# Patient Record
Sex: Male | Born: 1990 | Race: White | Hispanic: No | Smoking: Current every day smoker
Health system: Southern US, Community
[De-identification: ages and names within clinical notes are randomized; demographics above are authoritative.]

---
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
## 2010-01-30 ENCOUNTER — Emergency Department (HOSPITAL_COMMUNITY): Admission: EM | Admit: 2010-01-30 | Discharge: 2010-01-30 | Payer: Self-pay | Admitting: Emergency Medicine

## 2010-07-09 ENCOUNTER — Emergency Department (HOSPITAL_COMMUNITY): Admission: EM | Admit: 2010-07-09 | Discharge: 2010-07-10 | Payer: Self-pay | Admitting: Emergency Medicine

## 2010-07-12 ENCOUNTER — Emergency Department (HOSPITAL_COMMUNITY): Admission: EM | Admit: 2010-07-12 | Discharge: 2010-07-12 | Payer: Self-pay | Admitting: Emergency Medicine

## 2010-09-15 ENCOUNTER — Emergency Department (HOSPITAL_COMMUNITY)
Admission: EM | Admit: 2010-09-15 | Discharge: 2010-09-15 | Payer: Self-pay | Source: Home / Self Care | Admitting: Emergency Medicine

## 2010-10-15 ENCOUNTER — Emergency Department (HOSPITAL_COMMUNITY)
Admission: EM | Admit: 2010-10-15 | Discharge: 2010-10-15 | Payer: Self-pay | Source: Home / Self Care | Admitting: Emergency Medicine

## 2010-10-15 LAB — COMPREHENSIVE METABOLIC PANEL
Albumin: 4.3 g/dL (ref 3.5–5.2)
BUN: 9 mg/dL (ref 6–23)
Calcium: 9.5 mg/dL (ref 8.4–10.5)
Creatinine, Ser: 0.84 mg/dL (ref 0.4–1.5)
Total Protein: 6.9 g/dL (ref 6.0–8.3)

## 2010-10-15 LAB — DIFFERENTIAL
Eosinophils Absolute: 0.1 10*3/uL (ref 0.0–0.7)
Eosinophils Relative: 3 % (ref 0–5)
Lymphs Abs: 1.3 10*3/uL (ref 0.7–4.0)
Monocytes Absolute: 0.3 10*3/uL (ref 0.1–1.0)
Monocytes Relative: 8 % (ref 3–12)

## 2010-10-15 LAB — CBC
MCH: 31.4 pg (ref 26.0–34.0)
MCHC: 35.8 g/dL (ref 30.0–36.0)
MCV: 87.7 fL (ref 78.0–100.0)
Platelets: 236 10*3/uL (ref 150–400)
RDW: 13 % (ref 11.5–15.5)
WBC: 4 10*3/uL (ref 4.0–10.5)

## 2010-10-28 ENCOUNTER — Emergency Department (HOSPITAL_COMMUNITY)
Admission: EM | Admit: 2010-10-28 | Discharge: 2010-10-28 | Disposition: A | Discharge status: Home / Self Care | Payer: Self-pay | Attending: Emergency Medicine | Admitting: Emergency Medicine

## 2010-10-28 DIAGNOSIS — T23179A Burn of first degree of unspecified wrist, initial encounter: Secondary | ICD-10-CM | POA: Insufficient documentation

## 2010-10-28 DIAGNOSIS — Y93G3 Activity, cooking and baking: Secondary | ICD-10-CM | POA: Insufficient documentation

## 2010-10-28 DIAGNOSIS — T23169A Burn of first degree of back of unspecified hand, initial encounter: Secondary | ICD-10-CM | POA: Insufficient documentation

## 2010-10-28 DIAGNOSIS — X12XXXA Contact with other hot fluids, initial encounter: Secondary | ICD-10-CM | POA: Insufficient documentation

## 2010-10-28 DIAGNOSIS — X131XXA Other contact with steam and other hot vapors, initial encounter: Secondary | ICD-10-CM | POA: Insufficient documentation

## 2010-11-27 LAB — URINALYSIS, ROUTINE W REFLEX MICROSCOPIC
Glucose, UA: NEGATIVE mg/dL
Hgb urine dipstick: NEGATIVE
Specific Gravity, Urine: 1.025 (ref 1.005–1.030)

## 2011-03-21 ENCOUNTER — Emergency Department (HOSPITAL_COMMUNITY)
Admission: EM | Admit: 2011-03-21 | Discharge: 2011-03-21 | Disposition: A | Discharge status: Home / Self Care | Payer: Self-pay | Attending: Emergency Medicine | Admitting: Emergency Medicine

## 2011-03-21 ENCOUNTER — Emergency Department (HOSPITAL_COMMUNITY): Payer: Self-pay

## 2011-03-21 DIAGNOSIS — R296 Repeated falls: Secondary | ICD-10-CM | POA: Insufficient documentation

## 2011-03-21 DIAGNOSIS — M542 Cervicalgia: Secondary | ICD-10-CM | POA: Insufficient documentation

## 2011-03-21 DIAGNOSIS — IMO0002 Reserved for concepts with insufficient information to code with codable children: Secondary | ICD-10-CM | POA: Insufficient documentation

## 2011-03-21 DIAGNOSIS — Y9316 Activity, rowing, canoeing, kayaking, rafting and tubing: Secondary | ICD-10-CM | POA: Insufficient documentation

## 2011-05-11 ENCOUNTER — Emergency Department (HOSPITAL_COMMUNITY)
Admission: EM | Admit: 2011-05-11 | Discharge: 2011-05-11 | Disposition: A | Discharge status: Home / Self Care | Payer: Self-pay | Attending: Emergency Medicine | Admitting: Emergency Medicine

## 2011-05-11 ENCOUNTER — Emergency Department (HOSPITAL_COMMUNITY): Payer: Self-pay

## 2011-05-11 DIAGNOSIS — S43402A Unspecified sprain of left shoulder joint, initial encounter: Secondary | ICD-10-CM

## 2011-05-11 DIAGNOSIS — IMO0002 Reserved for concepts with insufficient information to code with codable children: Secondary | ICD-10-CM | POA: Insufficient documentation

## 2011-05-11 DIAGNOSIS — F17200 Nicotine dependence, unspecified, uncomplicated: Secondary | ICD-10-CM | POA: Insufficient documentation

## 2011-05-11 DIAGNOSIS — Y92009 Unspecified place in unspecified non-institutional (private) residence as the place of occurrence of the external cause: Secondary | ICD-10-CM | POA: Insufficient documentation

## 2011-05-11 DIAGNOSIS — W1789XA Other fall from one level to another, initial encounter: Secondary | ICD-10-CM | POA: Insufficient documentation

## 2011-05-11 MED ORDER — OXYCODONE-ACETAMINOPHEN 5-325 MG PO TABS
1.0000 | ORAL_TABLET | Freq: Once | ORAL | Status: AC
  Administered 2011-05-11: 1 via ORAL
  Filled 2011-05-11: qty 1

## 2011-05-11 MED ORDER — NAPROXEN 500 MG PO TABS: 500.0000 mg | ORAL_TABLET | Freq: Two times a day (BID) | ORAL | Status: AC
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
## 2011-06-11 LAB — CBC
HCT: 40.2
Hemoglobin: 14.3
MCHC: 35.6
MCV: 90.4
Platelets: 221
RBC: 4.45
RDW: 13.3
WBC: 10.7

## 2011-06-11 LAB — BASIC METABOLIC PANEL
CO2: 29
Calcium: 9.8
Chloride: 104
Creatinine, Ser: 0.93
Glucose, Bld: 107 — ABNORMAL HIGH
Sodium: 142

## 2011-06-11 LAB — DIFFERENTIAL
Basophils Absolute: 0
Basophils Relative: 0
Eosinophils Absolute: 0.1
Eosinophils Relative: 1
Lymphocytes Relative: 11 — ABNORMAL LOW
Lymphs Abs: 1.1
Monocytes Absolute: 0.4
Monocytes Relative: 4
Neutro Abs: 9.1 — ABNORMAL HIGH
Neutrophils Relative %: 85 — ABNORMAL HIGH

## 2011-06-11 LAB — RAPID URINE DRUG SCREEN, HOSP PERFORMED
Amphetamines: NOT DETECTED
Barbiturates: NOT DETECTED
Benzodiazepines: NOT DETECTED
Cocaine: NOT DETECTED
Opiates: NOT DETECTED
Tetrahydrocannabinol: POSITIVE — AB

## 2011-06-11 LAB — ETHANOL: Alcohol, Ethyl (B): <5

## 2011-06-23 LAB — STREP A DNA PROBE

## 2011-12-06 ENCOUNTER — Emergency Department (HOSPITAL_COMMUNITY): Payer: BC Managed Care – PPO

## 2011-12-06 ENCOUNTER — Encounter (HOSPITAL_COMMUNITY): Payer: Self-pay | Admitting: Emergency Medicine

## 2011-12-06 ENCOUNTER — Emergency Department (HOSPITAL_COMMUNITY)
Admission: EM | Admit: 2011-12-06 | Discharge: 2011-12-06 | Disposition: A | Discharge status: Home / Self Care | Payer: BC Managed Care – PPO | Attending: Emergency Medicine | Admitting: Emergency Medicine

## 2011-12-06 DIAGNOSIS — F17200 Nicotine dependence, unspecified, uncomplicated: Secondary | ICD-10-CM | POA: Insufficient documentation

## 2011-12-06 DIAGNOSIS — M79609 Pain in unspecified limb: Secondary | ICD-10-CM | POA: Insufficient documentation

## 2011-12-06 DIAGNOSIS — M795 Residual foreign body in soft tissue: Secondary | ICD-10-CM | POA: Insufficient documentation

## 2011-12-06 MED ORDER — HYDROCODONE-ACETAMINOPHEN 5-325 MG PO TABS: 1.0000 | ORAL_TABLET | ORAL | Status: AC | PRN

## 2011-12-06 MED ORDER — IBUPROFEN 600 MG PO TABS: 600.0000 mg | ORAL_TABLET | Freq: Four times a day (QID) | ORAL | Status: AC | PRN

## 2011-12-06 MED ORDER — IBUPROFEN 800 MG PO TABS
800.0000 mg | ORAL_TABLET | Freq: Once | ORAL | Status: AC
  Administered 2011-12-06: 800 mg via ORAL
  Filled 2011-12-06: qty 1

## 2011-12-06 NOTE — Discharge Instructions (Signed)
Foreign Body When the skin is cut or punctured and some object is left in the tissue under the skin, that object is called a "foreign body". A foreign body could be a wood splinter, a thorn, a sliver of metal, a shard of glass, a cactus needle or the tip of a pencil. In most instances, your caregiver will recommend that the foreign body be removed. If it is not removed, infection, abscess formation, an allergic reaction, chronic pain and disability can occur over time. Sometimes, foreign bodies (particularly very small ones) can be difficult to locate. Your caregiver may recommend x-rays or ultrasound imaging to help find them. If removal is not successful, there may be a need to see a surgeon who might suggest further exploration in the operating room. Occasionally, tiny bits of metallic foreign material (such as shrapnel) are not removed, if it is felt that there would be no harm in leaving them untouched. HOME CARE INSTRUCTIONS  Rest the injured area and keep it elevated until all the pain and swelling are gone.   You will need a tetanus vaccination if you have not had one in the last 5 years.   Return to this facility, see your caregiver or follow-up as instructed in 2 days.  SEEK IMMEDIATE MEDICAL CARE IF:   You develop increasing redness or swelling of the skin near the wound.   You develop drainage of pus from the wound.   You have persistent pain or loss of motion.   You have red streaks extending above or below the wound location.  MAKE SURE YOU:   Understand these instructions.   Will watch your condition.   Will get help right away if you are not doing well or get worse.  Document Released: 09/01/2005 Document Revised: 08/21/2011 Document Reviewed: 08/03/2009 [facility] Patient Information 2012 [place], [place].   You may take the hydrocodone prescribed for pain relief.  This will make you drowsy - do not drive within 4 hours of taking this medication.
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
## 2011-12-08 NOTE — ED Provider Notes (Signed)
Medical screening examination/treatment/procedure(s) were performed by non-physician practitioner and as supervising physician I was immediately available for consultation/collaboration.  Shelda Jakes, MD 12/08/11 [phone]

## 2012-11-20 ENCOUNTER — Encounter (HOSPITAL_COMMUNITY): Payer: Self-pay | Admitting: *Deleted

## 2012-11-20 ENCOUNTER — Emergency Department (HOSPITAL_COMMUNITY)
Admission: EM | Admit: 2012-11-20 | Discharge: 2012-11-20 | Disposition: A | Discharge status: Home / Self Care | Payer: Self-pay | Attending: Emergency Medicine | Admitting: Emergency Medicine

## 2012-11-20 DIAGNOSIS — R21 Rash and other nonspecific skin eruption: Secondary | ICD-10-CM | POA: Insufficient documentation

## 2012-11-20 DIAGNOSIS — F17200 Nicotine dependence, unspecified, uncomplicated: Secondary | ICD-10-CM | POA: Insufficient documentation

## 2012-11-20 DIAGNOSIS — IMO0002 Reserved for concepts with insufficient information to code with codable children: Secondary | ICD-10-CM | POA: Insufficient documentation

## 2012-11-20 DIAGNOSIS — M7989 Other specified soft tissue disorders: Secondary | ICD-10-CM | POA: Insufficient documentation

## 2012-11-20 DIAGNOSIS — R209 Unspecified disturbances of skin sensation: Secondary | ICD-10-CM | POA: Insufficient documentation

## 2012-11-20 MED ORDER — SULFAMETHOXAZOLE-TRIMETHOPRIM 800-160 MG PO TABS: 1.0000 | ORAL_TABLET | Freq: Two times a day (BID) | ORAL | Status: DC

## 2012-11-20 MED ORDER — SULFAMETHOXAZOLE-TMP DS 800-160 MG PO TABS
1.0000 | ORAL_TABLET | Freq: Once | ORAL | Status: AC
  Administered 2012-11-20: 1 via ORAL
  Filled 2012-11-20: qty 1

## 2012-11-20 MED ORDER — HYDROCODONE-ACETAMINOPHEN 5-325 MG PO TABS: ORAL_TABLET | ORAL | Status: DC

## 2012-11-20 NOTE — ED Notes (Signed)
PA-C at bedside 

## 2012-11-20 NOTE — ED Provider Notes (Signed)
History     CSN: [id number]  Arrival date & time 11/20/12  1442   First MD Initiated Contact with Patient 11/20/12 1541      Chief Complaint  Patient presents with  . Cellulitis    (Consider location/radiation/quality/duration/timing/severity/associated sxs/prior treatment) HPI Comments: Patient complains of pain to his left upper arm with redness and swelling after receiving a tattoo to the same area 3 days ago.  States that he noticed redness 2 days ago with significant drainage from the tattoo. He states that his arm is painful to touch and worse with movement. He denies fever, chills, numbness or weakness to the left upper extremity he also denies any neck pain or other skin lesions.  Patient is a 22 y.o. male presenting with arm injury. The history is provided by the patient.  Arm Injury Location:  Arm Time since incident:  3 days Injury: no   Arm location:  L upper arm Pain details:    Quality:  Aching, throbbing and tingling   Radiates to:  L arm   Severity:  Moderate   Onset quality:  Gradual   Timing:  Constant   Progression:  Worsening Chronicity:  New Handedness:  Right-handed Dislocation: no   Foreign body present:  No foreign bodies Prior injury to area:  No Relieved by:  Nothing Worsened by:  Movement Ineffective treatments:  None tried Associated symptoms: swelling and tingling   Associated symptoms: no decreased range of motion, no fever, no neck pain, no numbness and no stiffness   Risk factors: no recent illness     History reviewed. No pertinent past medical history.  History reviewed. No pertinent past surgical history.  No family history on file.  History  Substance Use Topics  . Smoking status: Current Every Day Smoker  . Smokeless tobacco: Not on file  . Alcohol Use: No      Review of Systems  Constitutional: Negative for fever, chills, activity change and appetite change.  HENT: Negative for sore throat, facial swelling, trouble  swallowing, neck pain and neck stiffness.   Respiratory: Negative for chest tightness, shortness of breath and wheezing.   Musculoskeletal: Negative for stiffness.  Skin: Positive for color change. Negative for rash and wound.       Tattoo to left upper arm  Neurological: Negative for dizziness, weakness, numbness and headaches.  All other systems reviewed and are negative.    Allergies  Review of patient's allergies indicates no known allergies.  Home Medications   Current Outpatient Rx  Name  Route  Sig  Dispense  Refill  . acetaminophen (TYLENOL) 500 MG tablet   Oral   Take 1,000 mg by mouth every 6 (six) hours as needed for pain.         Marland Kitchen ibuprofen (ADVIL,MOTRIN) 200 MG tablet   Oral   Take 600 mg by mouth every 6 (six) hours as needed for pain.           BP 127/65  Pulse 77  Temp(Src) 98.2 F (36.8 C) (Oral)  Resp 20  Ht 5\' 7"  (1.702 m)  Wt 150 lb (68.04 kg)  BMI 23.49 kg/m2  SpO2 100%  Physical Exam  Nursing note and vitals reviewed. Constitutional: He is oriented to person, place, and time. He appears well-developed and well-nourished. No distress.  HENT:  Head: Normocephalic and atraumatic.  Mouth/Throat: Oropharynx is clear and moist.  Neck: Normal range of motion. Neck supple.  Cardiovascular: Normal rate, regular rhythm, normal heart sounds and  intact distal pulses.   No murmur heard. Pulmonary/Chest: Effort normal and breath sounds normal. No respiratory distress. He exhibits no tenderness.  Musculoskeletal: Normal range of motion. He exhibits tenderness. He exhibits no edema.       Left upper arm: He exhibits tenderness and swelling. He exhibits no bony tenderness, no deformity and no laceration.       Arms: see skin exam  Lymphadenopathy:    He has no cervical adenopathy.  Neurological: He is alert and oriented to person, place, and time. No cranial nerve deficit or sensory deficit. He exhibits normal muscle tone. Coordination normal.  Skin:  Rash noted. There is erythema.  Recent tattoo to the medial aspect of the left upper arm with significant tenderness to palpation and surrounding erythema. There is a mild serous drainage and induration surrounding the tattoo. Distal sensation is intact, grip strength is equal bilaterally. Radial pulses are brisk and symmetrical. Patient has full range of motion of the upper extremity.    ED Course  Procedures (including critical care time)  Labs Reviewed - No data to display No results found.      MDM    Patient has a 3-4 day old tattoo of th left upper arm with serous drainage an surrounding erythema.  Likely cellulitis.  Will treat with antibiotic and pain medications.  Patient agrees to elevate, warm compresses, and return here in 2 days for recehck.  Tattoo was cleaned and bandaged by the nursing staff. Initial dose of antibiotic given  Patient appears stable for discharge. He agrees to care plan and verbalized understanding.  Prescribed Bactrim DS Norco #20   Tammy L. Trisha Mangle, PA-C 11/21/12 1805

## 2012-11-20 NOTE — ED Notes (Signed)
Pt c/o infection to tattoo. States that the area is red, raised, drainage noted. Received tattoo on Wednesday.

## 2012-11-22 ENCOUNTER — Encounter (HOSPITAL_COMMUNITY): Payer: Self-pay | Admitting: *Deleted

## 2012-11-22 ENCOUNTER — Emergency Department (HOSPITAL_COMMUNITY)
Admission: EM | Admit: 2012-11-22 | Discharge: 2012-11-22 | Disposition: A | Discharge status: Home / Self Care | Payer: Self-pay | Attending: Emergency Medicine | Admitting: Emergency Medicine

## 2012-11-22 DIAGNOSIS — IMO0002 Reserved for concepts with insufficient information to code with codable children: Secondary | ICD-10-CM | POA: Insufficient documentation

## 2012-11-22 DIAGNOSIS — F17200 Nicotine dependence, unspecified, uncomplicated: Secondary | ICD-10-CM | POA: Insufficient documentation

## 2012-11-22 MED ORDER — NAPROXEN 500 MG PO TABS: 500.0000 mg | ORAL_TABLET | Freq: Two times a day (BID) | ORAL | Status: DC

## 2012-11-22 NOTE — ED Notes (Addendum)
Infected tattoo to lt upper.arm  Seen here for same

## 2012-11-23 NOTE — ED Provider Notes (Signed)
Medical screening examination/treatment/procedure(s) were performed by non-physician practitioner and as supervising physician I was immediately available for consultation/collaboration.   Shelda Jakes, MD 11/23/12 [phone]

## 2012-11-24 NOTE — ED Provider Notes (Signed)
History     CSN: [id number]  Arrival date & time 11/22/12  1350   First MD Initiated Contact with Patient 11/22/12 1441      Chief Complaint  Patient presents with  . Wound Check    (Consider location/radiation/quality/duration/timing/severity/associated sxs/prior treatment) HPI Comments: Patient returns to ED for re-evaluation of an infected tattoo.  Was seen here 2 days prior to this visit and also seen by me at that time.  States the pain has improved and the redness has also improved.  Currently taking bactrim DS and norco.  He denies any worsening symptoms  Patient is a 22 y.o. male presenting with wound check. The history is provided by the patient.  Wound Check This is a new problem. The current episode started in the past 7 days. The problem has been gradually improving. Pertinent negatives include no arthralgias, chills, fever, joint swelling, neck pain, numbness, sore throat, vomiting or weakness. The symptoms are aggravated by bending. He has tried oral narcotics (warm compresses and oral antibiotics) for the symptoms. The treatment provided significant relief.    History reviewed. No pertinent past medical history.  History reviewed. No pertinent past surgical history.  History reviewed. No pertinent family history.  History  Substance Use Topics  . Smoking status: Current Every Day Smoker  . Smokeless tobacco: Not on file  . Alcohol Use: No      Review of Systems  Constitutional: Negative for fever and chills.  HENT: Negative for sore throat, facial swelling, trouble swallowing, neck pain and neck stiffness.   Respiratory: Negative for chest tightness, shortness of breath and wheezing.   Gastrointestinal: Negative for vomiting.  Musculoskeletal: Negative for joint swelling and arthralgias.  Skin: Positive for color change.  Neurological: Negative for weakness and numbness.  All other systems reviewed and are negative.    Allergies  Review of patient's  allergies indicates no known allergies.  Home Medications   Current Outpatient Rx  Name  Route  Sig  Dispense  Refill  . acetaminophen (TYLENOL) 500 MG tablet   Oral   Take 1,000 mg by mouth every 6 (six) hours as needed for pain.         Marland Kitchen HYDROcodone-acetaminophen (NORCO/VICODIN) 5-325 MG per tablet   Oral   Take 1-2 tablets by mouth every 4 (four) hours as needed. Take one-two tabs po q 4-6 hrs prn pain         . ibuprofen (ADVIL,MOTRIN) 200 MG tablet   Oral   Take 600 mg by mouth every 6 (six) hours as needed for pain.         Marland Kitchen sulfamethoxazole-trimethoprim (SEPTRA DS) 800-160 MG per tablet   Oral   Take 1 tablet by mouth 2 (two) times daily.   28 tablet   0   . naproxen (NAPROSYN) 500 MG tablet   Oral   Take 1 tablet (500 mg total) by mouth 2 (two) times daily with a meal.   20 tablet   0     BP 125/63  Pulse 86  Temp(Src) 98.2 F (36.8 C) (Oral)  Resp 18  Ht 5\' 7"  (1.702 m)  Wt 150 lb (68.04 kg)  BMI 23.49 kg/m2  SpO2 96%  Physical Exam  Nursing note and vitals reviewed. Constitutional: He is oriented to person, place, and time. He appears well-developed and well-nourished. No distress.  HENT:  Head: Normocephalic and atraumatic.  Cardiovascular: Normal rate, regular rhythm and normal heart sounds.   No murmur heard. Pulmonary/Chest: Effort  normal and breath sounds normal.  Musculoskeletal: Normal range of motion. He exhibits tenderness. He exhibits no edema.  Tattoo to the medial aspect of the left upper arm with previous erythema and drainage.  Erythema has greatly diminished,  No induration or drainage present.  Distal sensation intact, radial pulse brisk, pt has FROM of the left UE  Neurological: He is alert and oriented to person, place, and time. He exhibits normal muscle tone. Coordination normal.  Skin: Skin is warm.  See MS exam    ED Course  Procedures (including critical care time)  Labs Reviewed - No data to display No results  found.   1. Cellulitis       MDM   Pt seen here today for recheck of cellulitis secondary to tattoo.  Seen by me on initial visit as well.  Previous sx's greatly improved.  Appears to be healing well.  No further drainage or increased erythema.  Pt agrees to cont warm compresses and antibiotics, return here if needed  The patient appears reasonably screened and/or stabilized for discharge and I doubt any other medical condition or other [facility] requiring further screening, evaluation, or treatment in the ED at this time prior to discharge.       Tammy L. Trisha Mangle, PA-C 11/24/12 1544

## 2012-11-25 NOTE — ED Provider Notes (Signed)
Medical screening examination/treatment/procedure(s) were performed by non-physician practitioner and as supervising physician I was immediately available for consultation/collaboration. Iva Knapp, MD, FACEP   Iva L Knapp, MD 11/25/12 0021 

## 2014-09-21 ENCOUNTER — Encounter (HOSPITAL_COMMUNITY): Payer: Self-pay | Admitting: *Deleted

## 2014-09-21 ENCOUNTER — Emergency Department (HOSPITAL_COMMUNITY): Payer: BLUE CROSS/BLUE SHIELD

## 2014-09-21 ENCOUNTER — Emergency Department (HOSPITAL_COMMUNITY)
Admission: EM | Admit: 2014-09-21 | Discharge: 2014-09-21 | Disposition: A | Discharge status: Home / Self Care | Payer: BLUE CROSS/BLUE SHIELD | Attending: Emergency Medicine | Admitting: Emergency Medicine

## 2014-09-21 DIAGNOSIS — Y288XXA Contact with other sharp object, undetermined intent, initial encounter: Secondary | ICD-10-CM | POA: Diagnosis not present

## 2014-09-21 DIAGNOSIS — Z720 Tobacco use: Secondary | ICD-10-CM | POA: Diagnosis not present

## 2014-09-21 DIAGNOSIS — T148XXA Other injury of unspecified body region, initial encounter: Secondary | ICD-10-CM

## 2014-09-21 DIAGNOSIS — Y929 Unspecified place or not applicable: Secondary | ICD-10-CM | POA: Diagnosis not present

## 2014-09-21 DIAGNOSIS — Y9389 Activity, other specified: Secondary | ICD-10-CM | POA: Insufficient documentation

## 2014-09-21 DIAGNOSIS — Z792 Long term (current) use of antibiotics: Secondary | ICD-10-CM | POA: Insufficient documentation

## 2014-09-21 DIAGNOSIS — S91342A Puncture wound with foreign body, left foot, initial encounter: Secondary | ICD-10-CM | POA: Insufficient documentation

## 2014-09-21 DIAGNOSIS — Z791 Long term (current) use of non-steroidal anti-inflammatories (NSAID): Secondary | ICD-10-CM | POA: Diagnosis not present

## 2014-09-21 DIAGNOSIS — Y998 Other external cause status: Secondary | ICD-10-CM | POA: Diagnosis not present

## 2014-09-21 DIAGNOSIS — S99922A Unspecified injury of left foot, initial encounter: Secondary | ICD-10-CM

## 2014-09-21 MED ORDER — CELECOXIB 100 MG PO CAPS: 100.0000 mg | ORAL_CAPSULE | Freq: Two times a day (BID) | ORAL | Status: DC

## 2014-09-21 MED ORDER — CIPROFLOXACIN HCL 250 MG PO TABS
500.0000 mg | ORAL_TABLET | Freq: Once | ORAL | Status: AC
  Administered 2014-09-21: 500 mg via ORAL
  Filled 2014-09-21: qty 2

## 2014-09-21 MED ORDER — KETOROLAC TROMETHAMINE 10 MG PO TABS
10.0000 mg | ORAL_TABLET | Freq: Once | ORAL | Status: AC
  Administered 2014-09-21: 10 mg via ORAL
  Filled 2014-09-21: qty 1

## 2014-09-21 MED ORDER — HYDROCODONE-ACETAMINOPHEN 5-325 MG PO TABS
1.0000 | ORAL_TABLET | Freq: Once | ORAL | Status: AC
  Administered 2014-09-21: 1 via ORAL
  Filled 2014-09-21: qty 1

## 2014-09-21 MED ORDER — HYDROCODONE-ACETAMINOPHEN 5-325 MG PO TABS: 1.0000 | ORAL_TABLET | ORAL | Status: DC | PRN

## 2014-09-21 MED ORDER — CIPROFLOXACIN HCL 500 MG PO TABS: 500.0000 mg | ORAL_TABLET | Freq: Two times a day (BID) | ORAL | Status: DC

## 2014-09-21 NOTE — ED Provider Notes (Signed)
CSN: [id number]     Arrival date & time 09/21/14  1453 History   First MD Initiated Contact with Patient 09/21/14 1551     Chief Complaint  Patient presents with  . Foot Injury     (Consider location/radiation/quality/duration/timing/severity/associated sxs/prior Treatment) Patient is a 24 y.o. male presenting with foot injury. The history is provided by the patient.  Foot Injury Location:  Foot Foot location:  L foot Pain details:    Quality:  Throbbing   Radiates to:  Does not radiate   Severity:  Moderate   Onset quality:  Gradual   Duration:  2 days   Timing:  Intermittent   Progression:  Worsening Chronicity:  New Dislocation: no   Prior injury to area:  No Relieved by:  Nothing Ineffective treatments: warm tub soak. Associated symptoms: swelling   Associated symptoms: no back pain, no fatigue, no fever, no neck pain and no numbness   Risk factors: no frequent fractures and no known bone disorder     History reviewed. No pertinent past medical history. History reviewed. No pertinent past surgical history. History reviewed. No pertinent family history. History  Substance Use Topics  . Smoking status: Current Every Day Smoker    Types: Cigarettes  . Smokeless tobacco: Not on file  . Alcohol Use: No    Review of Systems  Constitutional: Negative for fever, activity change and fatigue.       All ROS Neg except as noted in HPI  Eyes: Negative for photophobia and discharge.  Respiratory: Negative for cough, shortness of breath and wheezing.   Cardiovascular: Negative for chest pain and palpitations.  Gastrointestinal: Negative for abdominal pain and blood in stool.  Genitourinary: Negative for dysuria, frequency and hematuria.  Musculoskeletal: Negative for back pain, arthralgias and neck pain.  Skin: Negative.   Neurological: Negative for dizziness, seizures and speech difficulty.  Psychiatric/Behavioral: Negative for hallucinations and confusion.       Allergies  Review of patient's allergies indicates no known allergies.  Home Medications   Prior to Admission medications   Medication Sig Start Date End Date Taking? Authorizing Provider  acetaminophen (TYLENOL) 500 MG tablet Take 1,000 mg by mouth every 6 (six) hours as needed for pain.   Yes Historical Provider, MD  ibuprofen (ADVIL,MOTRIN) 200 MG tablet Take 600 mg by mouth every 6 (six) hours as needed for pain.   Yes Historical Provider, MD  naproxen (NAPROSYN) 500 MG tablet Take 1 tablet (500 mg total) by mouth 2 (two) times daily with a meal. Patient not taking: Reported on 09/21/2014 11/22/12   Tammy L. Triplett, PA-C  sulfamethoxazole-trimethoprim (SEPTRA DS) 800-160 MG per tablet Take 1 tablet by mouth 2 (two) times daily. Patient not taking: Reported on 09/21/2014 11/20/12   Tammy L. Triplett, PA-C   BP 135/62 mmHg  Pulse 73  Temp(Src) 97.8 F (36.6 C) (Oral)  Resp 18  Ht 5\' 10"  (1.778 m)  Wt 146 lb 1 oz (66.254 kg)  BMI 20.96 kg/m2  SpO2 99% Physical Exam  Constitutional: He is oriented to person, place, and time. He appears well-developed and well-nourished.  Non-toxic appearance.  HENT:  Head: Normocephalic.  Right Ear: Tympanic membrane and external ear normal.  Left Ear: Tympanic membrane and external ear normal.  Eyes: EOM and lids are normal. Pupils are equal, round, and reactive to light.  Neck: Normal range of motion. Neck supple. Carotid bruit is not present.  Cardiovascular: Normal rate, regular rhythm, normal heart sounds, intact distal pulses  and normal pulses.   Pulmonary/Chest: Breath sounds normal. No respiratory distress.  Abdominal: Soft. Bowel sounds are normal. There is no tenderness. There is no guarding.  Musculoskeletal: Normal range of motion.  Small puncture wound just below the metatarsal phalangeal heads between the third and fourth digits of the left foot. There is increased puffiness around the puncture site. There is no active bleeding,  there is no red streaks on the plantar surface. There is a small area of redness between the third and fourth toe on the dorsal surface. The area is not hot. There is good range of motion of the toes. The Achilles tendon is intact.  Lymphadenopathy:       Head (right side): No submandibular adenopathy present.       Head (left side): No submandibular adenopathy present.    He has no cervical adenopathy.  Neurological: He is alert and oriented to person, place, and time. He has normal strength. No cranial nerve deficit or sensory deficit.  Skin: Skin is warm and dry.  Psychiatric: He has a normal mood and affect. His speech is normal.  Nursing note and vitals reviewed.   ED Course  Procedures (including critical care time) Labs Review Labs Reviewed - No data to display  Imaging Review No results found.   EKG Interpretation None      MDM  Vital signs are well within normal limits.   X-ray of the left foot is negative for any fracture, or foreign body, or gas. No gross neurovascular changes appreciated on examination of the left foot. Patient states his tetanus is up-to-date. The plan at this time is for the patient be placed on Cipro 2 times daily, Celebrex 2 times daily, and Norco every 4 hours if needed for pain. The patient will continue warm tub soaks to this issue has resolved. He's been given instructions to return if any signs of advancing infection.    Final diagnoses:  Puncture wound    *I have reviewed nursing notes, vital signs, and all appropriate lab and imaging results for this patient.**    Kathie Dike, PA-C 09/21/14 1728  Juliet Rude. Rubin Payor, MD 09/22/14 7829
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
## 2014-09-21 NOTE — Discharge Instructions (Signed)
Your x-ray is negative for fracture, dislocation, or signs of infection. There is no foreign body appreciated on x-ray today. Please continue to soak her foot in warm Epsom salt water daily. Please use clean white socks until this puncture wound has completely resolved. Please use Cipro and Celebrex 2 times daily with food. May use Norco for pain if needed for more severe pain. Please see Dr. Hilda LiasKeeling for orthopedic evaluation if not improving. Puncture Wound A puncture wound happens when a sharp object pokes a hole in the skin. A puncture wound can cause an infection because germs can go under the skin during the injury. HOME CARE   Change your bandage (dressing) once a day, or as told by your doctor. If the bandage sticks, soak it in water.  Keep all doctor visits as told.  Only take medicine as told by your doctor.  Take your medicine (antibiotics) as told. Finish them even if you start to feel better. You may need a tetanus shot if:  You cannot remember when you had your last tetanus shot.  You have never had a tetanus shot. If you need a tetanus shot and you choose not to have one, you may get tetanus. Sickness from tetanus can be serious. You may need a rabies shot if an animal bite caused your wound. GET HELP RIGHT AWAY IF:   Your wound is red, puffy (swollen), or painful.  You see red lines on the skin near the wound.  You have a bad smell coming from the wound or bandage.  You have yellowish-white fluid (pus) coming from the wound.  Your medicine is not working.  You notice an object in the wound.  You have a fever.  You have severe pain.  You have trouble breathing.  You feel dizzy or pass out (faint).  You keep throwing up (vomiting).  You lose feeling (numbness) in your arm or leg, or you cannot move your arm or leg.  Your problems get worse. MAKE SURE YOU:   Understand these instructions.  Will watch your condition.  Will get help right away if you are  not doing well or get worse. Document Released: 06/10/2008 Document Revised: 11/24/2011 Document Reviewed: 02/18/2011 [facility] Patient Information 2015 [place], [place]. This information is not intended to replace advice given to you by your health care provider. Make sure you discuss any questions you have with your health care provider.

## 2015-07-09 ENCOUNTER — Emergency Department (HOSPITAL_COMMUNITY): Payer: Self-pay

## 2015-07-09 ENCOUNTER — Emergency Department (HOSPITAL_COMMUNITY)
Admission: EM | Admit: 2015-07-09 | Discharge: 2015-07-09 | Disposition: A | Discharge status: Home / Self Care | Payer: Self-pay | Attending: Emergency Medicine | Admitting: Emergency Medicine

## 2015-07-09 ENCOUNTER — Encounter (HOSPITAL_COMMUNITY): Payer: Self-pay | Admitting: Emergency Medicine

## 2015-07-09 DIAGNOSIS — S32028A Other fracture of second lumbar vertebra, initial encounter for closed fracture: Secondary | ICD-10-CM | POA: Insufficient documentation

## 2015-07-09 DIAGNOSIS — S199XXA Unspecified injury of neck, initial encounter: Secondary | ICD-10-CM | POA: Insufficient documentation

## 2015-07-09 DIAGNOSIS — S79911A Unspecified injury of right hip, initial encounter: Secondary | ICD-10-CM | POA: Insufficient documentation

## 2015-07-09 DIAGNOSIS — Z720 Tobacco use: Secondary | ICD-10-CM | POA: Insufficient documentation

## 2015-07-09 DIAGNOSIS — Y92410 Unspecified street and highway as the place of occurrence of the external cause: Secondary | ICD-10-CM | POA: Insufficient documentation

## 2015-07-09 DIAGNOSIS — M25512 Pain in left shoulder: Secondary | ICD-10-CM

## 2015-07-09 DIAGNOSIS — S32009A Unspecified fracture of unspecified lumbar vertebra, initial encounter for closed fracture: Secondary | ICD-10-CM

## 2015-07-09 DIAGNOSIS — T148 Other injury of unspecified body region: Secondary | ICD-10-CM | POA: Insufficient documentation

## 2015-07-09 DIAGNOSIS — S4992XA Unspecified injury of left shoulder and upper arm, initial encounter: Secondary | ICD-10-CM | POA: Insufficient documentation

## 2015-07-09 DIAGNOSIS — S3991XA Unspecified injury of abdomen, initial encounter: Secondary | ICD-10-CM | POA: Insufficient documentation

## 2015-07-09 DIAGNOSIS — S32038A Other fracture of third lumbar vertebra, initial encounter for closed fracture: Secondary | ICD-10-CM | POA: Insufficient documentation

## 2015-07-09 DIAGNOSIS — T1490XA Injury, unspecified, initial encounter: Secondary | ICD-10-CM

## 2015-07-09 DIAGNOSIS — Y999 Unspecified external cause status: Secondary | ICD-10-CM | POA: Insufficient documentation

## 2015-07-09 DIAGNOSIS — Y9389 Activity, other specified: Secondary | ICD-10-CM | POA: Insufficient documentation

## 2015-07-09 LAB — I-STAT CHEM 8, ED
BUN: 18 mg/dL (ref 6–20)
CALCIUM ION: 1.16 mmol/L (ref 1.12–1.23)
CHLORIDE: 103 mmol/L (ref 101–111)
Creatinine, Ser: 1.1 mg/dL (ref 0.61–1.24)
Glucose, Bld: 95 mg/dL (ref 65–99)
HEMATOCRIT: 46 % (ref 39.0–52.0)
Hemoglobin: 15.6 g/dL (ref 13.0–17.0)
Potassium: 4.1 mmol/L (ref 3.5–5.1)
SODIUM: 139 mmol/L (ref 135–145)
TCO2: 22 mmol/L (ref 0–100)

## 2015-07-09 LAB — ETHANOL: ALCOHOL ETHYL (B): 85 mg/dL — AB (ref <5)

## 2015-07-09 MED ORDER — IBUPROFEN 600 MG PO TABS: 600.0000 mg | ORAL_TABLET | Freq: Four times a day (QID) | ORAL | Status: AC | PRN

## 2015-07-09 MED ORDER — MORPHINE SULFATE (PF) 4 MG/ML IV SOLN
4.0000 mg | Freq: Once | INTRAVENOUS | Status: AC
  Administered 2015-07-09: 4 mg via INTRAVENOUS
  Filled 2015-07-09: qty 1

## 2015-07-09 MED ORDER — HYDROCODONE-ACETAMINOPHEN 5-325 MG PO TABS: 1.0000 | ORAL_TABLET | Freq: Four times a day (QID) | ORAL | Status: AC | PRN

## 2015-07-09 MED ORDER — IOHEXOL 300 MG/ML  SOLN
100.0000 mL | Freq: Once | INTRAMUSCULAR | Status: AC | PRN
  Administered 2015-07-09: 100 mL via INTRAVENOUS

## 2015-07-09 MED ORDER — KETOROLAC TROMETHAMINE 30 MG/ML IJ SOLN
30.0000 mg | Freq: Once | INTRAMUSCULAR | Status: AC
  Administered 2015-07-09: 30 mg via INTRAVENOUS
  Filled 2015-07-09: qty 1

## 2015-07-09 MED ORDER — METHOCARBAMOL 500 MG PO TABS
750.0000 mg | ORAL_TABLET | Freq: Once | ORAL | Status: AC
  Administered 2015-07-09: 750 mg via ORAL
  Filled 2015-07-09: qty 2

## 2015-07-09 MED ORDER — METHOCARBAMOL 500 MG PO TABS: 500.0000 mg | ORAL_TABLET | Freq: Two times a day (BID) | ORAL | Status: AC

## 2015-07-09 MED ORDER — OXYCODONE-ACETAMINOPHEN 5-325 MG PO TABS
1.0000 | ORAL_TABLET | Freq: Once | ORAL | Status: AC
  Administered 2015-07-09: 1 via ORAL
  Filled 2015-07-09: qty 1
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
## 2015-07-09 NOTE — ED Notes (Signed)
Patient transported to CT 

## 2015-07-09 NOTE — Progress Notes (Signed)
Orthopedic Tech Progress Note Patient Details:  Tony RompJamie R Lockner 08/31/1991 [id number] Called [employer] for brace order Patient ID: Tony Cross, male   DOB: 08/31/1991, 24 y.o.   MRN: [id number]   Jennye MoccasinHughes, Lonney Revak Craig 07/09/2015, 10:28 PM

## 2015-07-09 NOTE — ED Notes (Signed)
Social services at bedside.
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
## 2015-07-09 NOTE — ED Provider Notes (Signed)
Patient signed out to me by previous provider to review x-rays.  All x-rays reviewed.  Abdominal CT scan shows that he has a transverse fracture of L2, L3, shoulder x-ray shows that he has a questionable avulsion fracture of the distal clavicle on reexamination patient has no discomfort.  Full range of motion of the left shoulder.  He is complaining of periumbilical pain.  Straight back to his back.  C-collar has been removed.  Patient has full range of motion without numbness or tingling to upper or lower extremities. I spoke with Dr. Lovell SheehanJenkins neurosurgery concerning the patient's transverse fractures.  He reviewed the CT scan , stating that no bracing should be required for this and no follow-up as it is a minor fracture. He could wear a lumbar wrap if desired. Will discharge patient home with pain control.  Muscle relaxers.  Earley FavorGail Diandre Merica, NP 07/09/15 2252  Donnetta HutchingBrian Cook, MD 07/12/15 1352

## 2015-07-09 NOTE — ED Notes (Addendum)
Paged ortho, states he will contact [employer].

## 2015-07-09 NOTE — ED Notes (Signed)
Pt ambulated to  Restroom and was able to void. Pt reports lower back pain but able to ambulate with standby assist.

## 2015-07-09 NOTE — Discharge Instructions (Signed)
Cryotherapy Cryotherapy is when you put ice on your injury. Ice helps lessen pain and puffiness (swelling) after an injury. Ice works the best when you start using it in the first 24 to 48 hours after an injury. HOME CARE  Put a dry or damp towel between the ice pack and your skin.  You may press gently on the ice pack.  Leave the ice on for no more than 10 to 20 minutes at a time.  Check your skin after 5 minutes to make sure your skin is okay.  Rest at least 20 minutes between ice pack uses.  Stop using ice when your skin loses feeling (numbness).  Do not use ice on someone who cannot tell you when it hurts. This includes small children and people with memory problems (dementia). GET HELP RIGHT AWAY IF:  You have white spots on your skin.  Your skin turns blue or pale.  Your skin feels waxy or hard.  Your puffiness gets worse. MAKE SURE YOU:   Understand these instructions.  Will watch your condition.  Will get help right away if you are not doing well or get worse.   This information is not intended to replace advice given to you by your health care provider. Make sure you discuss any questions you have with your health care provider.   Document Released: 02/18/2008 Document Revised: 11/24/2011 Document Reviewed: 04/24/2011 Elsevier Interactive Patient Education [employer] As discussed you have to minor fractures in your lower back.  These are called transverse process fractures.  I spoke with Dr. Lovell SheehanJenkins from neurosurgery.  He does not feel that you will need bracing or surgery or even follow-up for this injury.  You have been given prescriptions for pain medication, anti-inflammatories, and a muscle relaxer.  Please use these as directed.  Follow-up as needed
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
## 2022-01-09 ENCOUNTER — Other Ambulatory Visit: Payer: Self-pay

## 2022-01-09 ENCOUNTER — Encounter (HOSPITAL_COMMUNITY): Payer: Self-pay

## 2022-01-09 ENCOUNTER — Emergency Department (HOSPITAL_COMMUNITY)
Admission: EM | Admit: 2022-01-09 | Discharge: 2022-01-09 | Disposition: A | Discharge status: Home / Self Care | Payer: Self-pay | Attending: Emergency Medicine | Admitting: Emergency Medicine

## 2022-01-09 DIAGNOSIS — R21 Rash and other nonspecific skin eruption: Secondary | ICD-10-CM

## 2022-01-09 DIAGNOSIS — L03116 Cellulitis of left lower limb: Secondary | ICD-10-CM | POA: Insufficient documentation

## 2022-01-09 MED ORDER — PERMETHRIN 5 % EX CREA: TOPICAL_CREAM | CUTANEOUS | 1 refills | Status: AC

## 2022-01-09 MED ORDER — DOXYCYCLINE HYCLATE 100 MG PO CAPS: 100.0000 mg | ORAL_CAPSULE | Freq: Two times a day (BID) | ORAL | 0 refills | Status: AC

## 2022-01-09 MED ORDER — DOXYCYCLINE HYCLATE 100 MG PO TABS
100.0000 mg | ORAL_TABLET | Freq: Once | ORAL | Status: AC
  Administered 2022-01-09: 100 mg via ORAL
  Filled 2022-01-09: qty 1

## 2022-01-09 NOTE — ED Triage Notes (Addendum)
Pt has multiple open areas to arms, legs and face.  Several scratches that pt states came from moving scrap metal.  Pt also has other sores that he says that he doesn't know where they came from.  Reports that he has worms in stool.  Resp even and unlabored.  Skin warm and dry.  Denies fever.  nad ?

## 2022-01-09 NOTE — ED Provider Notes (Signed)
?[facility] ?Provider Note ? ? ?CSN: [id number] ?Arrival date & time: 01/09/22  2032 ? ?  ? ?History ? ?Chief Complaint  ?Patient presents with  ? Wound Check  ? ? ?Tony Cross is a 31 y.o. male. ? ? ?Wound Check ? ? ?This patient is a 31 year old male, denies having any chronic medical conditions but presents to the hospital today with a complaint of a rash that started over his body over the last several days.  Reports that this is itchy, starts out as a clear blister and then pops and usually scabs over.  There are several areas where they have become tender swollen and red including his left ankle.  He denies fevers or chills nausea or vomiting, states he has been exposed to some new dogs at the house which have had worms, he also noted some worms in his own stool but took some ivermectin and that seem to go away.  No fevers no vomiting no chills no diarrhea ? ?Home Medications ?Prior to Admission medications   ?Medication Sig Start Date End Date Taking? Authorizing Provider  ?doxycycline (VIBRAMYCIN) 100 MG capsule Take 1 capsule (100 mg total) by mouth 2 (two) times daily for 7 days. 01/09/22 01/16/22 Yes Noemi Chapel, MD  ?permethrin (ELIMITE) 5 % cream Apply to entire body other than face - let sit for 14 hours then wash off, may repeat in 1 week if still having symptoms 01/09/22  Yes Noemi Chapel, MD  ?acetaminophen (TYLENOL) 500 MG tablet Take 1,000 mg by mouth every 6 (six) hours as needed for pain.    [provider]  ?HYDROcodone-acetaminophen (NORCO/VICODIN) 5-325 MG tablet Take 1 tablet by mouth every 6 (six) hours as needed for severe pain. 07/09/15   Junius Creamer, NP  ?ibuprofen (ADVIL,MOTRIN) 600 MG tablet Take 1 tablet (600 mg total) by mouth every 6 (six) hours as needed. 07/09/15   Junius Creamer, NP  ?methocarbamol (ROBAXIN) 500 MG tablet Take 1 tablet (500 mg total) by mouth 2 (two) times daily. 07/09/15   Junius Creamer, NP  ?   ? ?Allergies    ?Patient has no  known allergies.   ? ?Review of Systems   ?Review of Systems  ?All other systems reviewed and are negative. ? ?Physical Exam ?Updated Vital Signs ?BP 133/84   Pulse (!) 104   Temp 97.9 ?F (36.6 ?C) (Oral)   Resp 20   Ht 1.778 m (5\' 10" )   Wt 72.6 kg   SpO2 100%   BMI 22.96 kg/m?  ?Physical Exam ?Vitals and nursing note reviewed.  ?Constitutional:   ?   General: He is not in acute distress. ?   Appearance: He is well-developed.  ?HENT:  ?   Head: Normocephalic and atraumatic.  ?   Mouth/Throat:  ?   Pharynx: No oropharyngeal exudate.  ?Eyes:  ?   General: No scleral icterus.    ?   Right eye: No discharge.     ?   Left eye: No discharge.  ?   Conjunctiva/sclera: Conjunctivae normal.  ?   Pupils: Pupils are equal, round, and reactive to light.  ?Neck:  ?   Thyroid: No thyromegaly.  ?   Vascular: No JVD.  ?Cardiovascular:  ?   Rate and Rhythm: Normal rate and regular rhythm.  ?   Heart sounds: Normal heart sounds. No murmur heard. ?  No friction rub. No gallop.  ?Pulmonary:  ?   Effort: Pulmonary effort is normal. No  respiratory distress.  ?   Breath sounds: Normal breath sounds. No wheezing or rales.  ?Abdominal:  ?   General: Bowel sounds are normal. There is no distension.  ?   Palpations: Abdomen is soft. There is no mass.  ?   Tenderness: There is no abdominal tenderness.  ?Musculoskeletal:     ?   General: No tenderness. Normal range of motion.  ?   Cervical back: Normal range of motion and neck supple.  ?Lymphadenopathy:  ?   Cervical: No cervical adenopathy.  ?Skin: ?   General: Skin is warm and dry.  ?   Findings: Erythema and rash present.  ?   Comments: Left ankle with mild tenderness and erythema around one of the lesions, extends approximately 3 cm in diameter, no fluctuance, no induration.  Multiple other scattered scabbed over lesions across the trunk abdomen arms and legs.  ?Neurological:  ?   Mental Status: He is alert.  ?   Coordination: Coordination normal.  ?   Comments: Normal speech, normal  gait  ?Psychiatric:     ?   Behavior: Behavior normal.  ? ? ?ED Results / Procedures / Treatments   ?Labs ?(all labs ordered are listed, but only abnormal results are displayed) ?Labs Reviewed - No data to display ? ?EKG ?None ? ?Radiology ?No results found. ? ?Procedures ?Procedures  ? ? ?Medications Ordered in ED ?Medications  ?doxycycline (VIBRA-TABS) tablet 100 mg (has no administration in time range)  ? ? ?ED Course/ Medical Decision Making/ A&P ?  ?                        ?Medical Decision Making ?Risk ?Prescription drug management. ? ? ?We will treat as possible scabies but also has some lesions that look like he may have a secondary bacterial infection.  Patient agreeable, no longer has worms in his stool, after taking ivermectin from a friend ? ? ? ? ? ? ? ?Final Clinical Impression(s) / ED Diagnoses ?Final diagnoses:  ?Rash  ?Cellulitis of left ankle  ? ? ?Rx / DC Orders ?ED Discharge Orders   ? ?      Ordered  ?  permethrin (ELIMITE) 5 % cream       ? 01/09/22 2211  ?  doxycycline (VIBRAMYCIN) 100 MG capsule  2 times daily       ? 01/09/22 2211  ? ?  ?  ? ?  ? ? ?  ?Noemi Chapel, MD ?01/09/22 2212 ? ?

## 2022-01-09 NOTE — Discharge Instructions (Signed)
It appears that you have a bit of a bacterial infection around your left ankle.  Please take the doxycycline twice a day for 7 days to help treat this.  I want you to apply the topical cream called permethrin to your entire body, leave it on for 14 hours and then wash it off.  You may need to repeat this.  You should also wash all of your close and your bed linens to make sure that you do not have scabies. ?

## 2022-06-10 DIAGNOSIS — F1520 Other stimulant dependence, uncomplicated: Secondary | ICD-10-CM | POA: Diagnosis not present

## 2022-06-10 DIAGNOSIS — F1220 Cannabis dependence, uncomplicated: Secondary | ICD-10-CM | POA: Diagnosis not present

## 2022-06-10 DIAGNOSIS — R69 Illness, unspecified: Secondary | ICD-10-CM | POA: Diagnosis not present

## 2022-06-10 DIAGNOSIS — F1120 Opioid dependence, uncomplicated: Secondary | ICD-10-CM | POA: Diagnosis not present

## 2022-06-10 DIAGNOSIS — F1320 Sedative, hypnotic or anxiolytic dependence, uncomplicated: Secondary | ICD-10-CM | POA: Diagnosis not present

## 2022-06-12 DIAGNOSIS — R69 Illness, unspecified: Secondary | ICD-10-CM | POA: Diagnosis not present

## 2022-06-12 DIAGNOSIS — F1120 Opioid dependence, uncomplicated: Secondary | ICD-10-CM | POA: Diagnosis not present

## 2022-06-13 DIAGNOSIS — R69 Illness, unspecified: Secondary | ICD-10-CM | POA: Diagnosis not present

## 2022-06-13 DIAGNOSIS — F1120 Opioid dependence, uncomplicated: Secondary | ICD-10-CM | POA: Diagnosis not present

## 2022-06-14 DIAGNOSIS — R69 Illness, unspecified: Secondary | ICD-10-CM | POA: Diagnosis not present

## 2022-06-14 DIAGNOSIS — F1120 Opioid dependence, uncomplicated: Secondary | ICD-10-CM | POA: Diagnosis not present

## 2022-07-03 DIAGNOSIS — R69 Illness, unspecified: Secondary | ICD-10-CM | POA: Diagnosis not present

## 2022-07-03 DIAGNOSIS — F1520 Other stimulant dependence, uncomplicated: Secondary | ICD-10-CM | POA: Diagnosis not present

## 2022-07-04 DIAGNOSIS — R69 Illness, unspecified: Secondary | ICD-10-CM | POA: Diagnosis not present

## 2022-07-04 DIAGNOSIS — F1520 Other stimulant dependence, uncomplicated: Secondary | ICD-10-CM | POA: Diagnosis not present

## 2022-07-05 DIAGNOSIS — R69 Illness, unspecified: Secondary | ICD-10-CM | POA: Diagnosis not present

## 2022-07-05 DIAGNOSIS — F1520 Other stimulant dependence, uncomplicated: Secondary | ICD-10-CM | POA: Diagnosis not present

## 2022-07-07 DIAGNOSIS — F1520 Other stimulant dependence, uncomplicated: Secondary | ICD-10-CM | POA: Diagnosis not present

## 2022-07-07 DIAGNOSIS — R69 Illness, unspecified: Secondary | ICD-10-CM | POA: Diagnosis not present

## 2022-07-08 DIAGNOSIS — R69 Illness, unspecified: Secondary | ICD-10-CM | POA: Diagnosis not present

## 2022-07-08 DIAGNOSIS — F1520 Other stimulant dependence, uncomplicated: Secondary | ICD-10-CM | POA: Diagnosis not present

## 2022-07-09 DIAGNOSIS — R69 Illness, unspecified: Secondary | ICD-10-CM | POA: Diagnosis not present

## 2022-07-09 DIAGNOSIS — F1520 Other stimulant dependence, uncomplicated: Secondary | ICD-10-CM | POA: Diagnosis not present

## 2022-07-10 DIAGNOSIS — R69 Illness, unspecified: Secondary | ICD-10-CM | POA: Diagnosis not present

## 2022-07-10 DIAGNOSIS — F1520 Other stimulant dependence, uncomplicated: Secondary | ICD-10-CM | POA: Diagnosis not present

## 2022-07-11 DIAGNOSIS — F1520 Other stimulant dependence, uncomplicated: Secondary | ICD-10-CM | POA: Diagnosis not present

## 2022-07-11 DIAGNOSIS — R69 Illness, unspecified: Secondary | ICD-10-CM | POA: Diagnosis not present

## 2022-07-12 DIAGNOSIS — R69 Illness, unspecified: Secondary | ICD-10-CM | POA: Diagnosis not present

## 2022-07-12 DIAGNOSIS — F1520 Other stimulant dependence, uncomplicated: Secondary | ICD-10-CM | POA: Diagnosis not present

## 2022-07-14 DIAGNOSIS — R69 Illness, unspecified: Secondary | ICD-10-CM | POA: Diagnosis not present

## 2022-07-14 DIAGNOSIS — F1520 Other stimulant dependence, uncomplicated: Secondary | ICD-10-CM | POA: Diagnosis not present

## 2022-07-15 DIAGNOSIS — F1520 Other stimulant dependence, uncomplicated: Secondary | ICD-10-CM | POA: Diagnosis not present

## 2022-07-15 DIAGNOSIS — R69 Illness, unspecified: Secondary | ICD-10-CM | POA: Diagnosis not present

## 2022-07-16 DIAGNOSIS — R69 Illness, unspecified: Secondary | ICD-10-CM | POA: Diagnosis not present

## 2022-07-16 DIAGNOSIS — F1520 Other stimulant dependence, uncomplicated: Secondary | ICD-10-CM | POA: Diagnosis not present

## 2022-07-17 DIAGNOSIS — F1520 Other stimulant dependence, uncomplicated: Secondary | ICD-10-CM | POA: Diagnosis not present

## 2022-07-17 DIAGNOSIS — R69 Illness, unspecified: Secondary | ICD-10-CM | POA: Diagnosis not present

## 2022-07-18 DIAGNOSIS — R69 Illness, unspecified: Secondary | ICD-10-CM | POA: Diagnosis not present

## 2022-07-18 DIAGNOSIS — F1520 Other stimulant dependence, uncomplicated: Secondary | ICD-10-CM | POA: Diagnosis not present

## 2022-07-19 DIAGNOSIS — R69 Illness, unspecified: Secondary | ICD-10-CM | POA: Diagnosis not present

## 2022-07-19 DIAGNOSIS — F1520 Other stimulant dependence, uncomplicated: Secondary | ICD-10-CM | POA: Diagnosis not present

## 2022-07-21 DIAGNOSIS — F1520 Other stimulant dependence, uncomplicated: Secondary | ICD-10-CM | POA: Diagnosis not present

## 2022-07-21 DIAGNOSIS — R69 Illness, unspecified: Secondary | ICD-10-CM | POA: Diagnosis not present

## 2022-07-22 DIAGNOSIS — R69 Illness, unspecified: Secondary | ICD-10-CM | POA: Diagnosis not present

## 2022-07-22 DIAGNOSIS — F1520 Other stimulant dependence, uncomplicated: Secondary | ICD-10-CM | POA: Diagnosis not present

## 2022-07-23 DIAGNOSIS — F1520 Other stimulant dependence, uncomplicated: Secondary | ICD-10-CM | POA: Diagnosis not present

## 2022-07-23 DIAGNOSIS — R69 Illness, unspecified: Secondary | ICD-10-CM | POA: Diagnosis not present

## 2022-07-24 DIAGNOSIS — F1520 Other stimulant dependence, uncomplicated: Secondary | ICD-10-CM | POA: Diagnosis not present

## 2022-07-24 DIAGNOSIS — R69 Illness, unspecified: Secondary | ICD-10-CM | POA: Diagnosis not present

## 2022-07-25 DIAGNOSIS — F1520 Other stimulant dependence, uncomplicated: Secondary | ICD-10-CM | POA: Diagnosis not present

## 2022-07-25 DIAGNOSIS — R69 Illness, unspecified: Secondary | ICD-10-CM | POA: Diagnosis not present

## 2022-07-26 DIAGNOSIS — F1520 Other stimulant dependence, uncomplicated: Secondary | ICD-10-CM | POA: Diagnosis not present

## 2022-07-26 DIAGNOSIS — R69 Illness, unspecified: Secondary | ICD-10-CM | POA: Diagnosis not present

## 2022-07-28 DIAGNOSIS — R69 Illness, unspecified: Secondary | ICD-10-CM | POA: Diagnosis not present

## 2022-07-28 DIAGNOSIS — F1520 Other stimulant dependence, uncomplicated: Secondary | ICD-10-CM | POA: Diagnosis not present

## 2022-07-29 DIAGNOSIS — R69 Illness, unspecified: Secondary | ICD-10-CM | POA: Diagnosis not present

## 2022-07-29 DIAGNOSIS — F1520 Other stimulant dependence, uncomplicated: Secondary | ICD-10-CM | POA: Diagnosis not present

## 2022-07-30 DIAGNOSIS — F1520 Other stimulant dependence, uncomplicated: Secondary | ICD-10-CM | POA: Diagnosis not present

## 2022-07-30 DIAGNOSIS — R69 Illness, unspecified: Secondary | ICD-10-CM | POA: Diagnosis not present

## 2022-07-31 DIAGNOSIS — R69 Illness, unspecified: Secondary | ICD-10-CM | POA: Diagnosis not present

## 2022-07-31 DIAGNOSIS — F1520 Other stimulant dependence, uncomplicated: Secondary | ICD-10-CM | POA: Diagnosis not present

## 2022-08-01 DIAGNOSIS — F1520 Other stimulant dependence, uncomplicated: Secondary | ICD-10-CM | POA: Diagnosis not present

## 2022-08-01 DIAGNOSIS — R69 Illness, unspecified: Secondary | ICD-10-CM | POA: Diagnosis not present

## 2022-08-02 DIAGNOSIS — R69 Illness, unspecified: Secondary | ICD-10-CM | POA: Diagnosis not present

## 2022-08-02 DIAGNOSIS — F1520 Other stimulant dependence, uncomplicated: Secondary | ICD-10-CM | POA: Diagnosis not present

## 2022-08-04 DIAGNOSIS — R69 Illness, unspecified: Secondary | ICD-10-CM | POA: Diagnosis not present

## 2022-08-04 DIAGNOSIS — F1520 Other stimulant dependence, uncomplicated: Secondary | ICD-10-CM | POA: Diagnosis not present

## 2022-08-05 DIAGNOSIS — R69 Illness, unspecified: Secondary | ICD-10-CM | POA: Diagnosis not present

## 2022-08-05 DIAGNOSIS — F1520 Other stimulant dependence, uncomplicated: Secondary | ICD-10-CM | POA: Diagnosis not present

## 2022-08-06 DIAGNOSIS — R69 Illness, unspecified: Secondary | ICD-10-CM | POA: Diagnosis not present

## 2022-08-13 DIAGNOSIS — R69 Illness, unspecified: Secondary | ICD-10-CM | POA: Diagnosis not present
